# Patient Record
Sex: Female | Born: 2000 | Race: Black or African American | Hispanic: No | Marital: Single | Smoking: Never smoker
Health system: Southern US, Community
[De-identification: ages and names within clinical notes are randomized; demographics above are authoritative.]

## PROBLEM LIST (undated history)

## (undated) HISTORY — PX: SKIN TAG REMOVAL: SHX780

---
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
## 2023-07-19 ENCOUNTER — Emergency Department (HOSPITAL_BASED_OUTPATIENT_CLINIC_OR_DEPARTMENT_OTHER)
Admission: EM | Admit: 2023-07-19 | Discharge: 2023-07-20 | Disposition: A | Discharge status: Home / Self Care | Payer: Medicaid Other | Attending: Emergency Medicine | Admitting: Emergency Medicine

## 2023-07-19 ENCOUNTER — Encounter (HOSPITAL_BASED_OUTPATIENT_CLINIC_OR_DEPARTMENT_OTHER): Payer: Self-pay | Admitting: Urology

## 2023-07-19 ENCOUNTER — Other Ambulatory Visit: Payer: Self-pay

## 2023-07-19 DIAGNOSIS — L02415 Cutaneous abscess of right lower limb: Secondary | ICD-10-CM | POA: Diagnosis present

## 2023-07-19 MED ORDER — OXYCODONE-ACETAMINOPHEN 5-325 MG PO TABS
1.0000 | ORAL_TABLET | Freq: Once | ORAL | Status: AC
  Administered 2023-07-19: 1 via ORAL
  Filled 2023-07-19: qty 1

## 2023-07-19 MED ORDER — LIDOCAINE-EPINEPHRINE (PF) 2 %-1:200000 IJ SOLN
20.0000 mL | Freq: Once | INTRAMUSCULAR | Status: AC
  Administered 2023-07-19: 20 mL
  Filled 2023-07-19: qty 20

## 2023-07-19 MED ORDER — DOXYCYCLINE HYCLATE 100 MG PO CAPS: 100.0000 mg | ORAL_CAPSULE | Freq: Two times a day (BID) | ORAL | 0 refills | Status: AC

## 2023-07-19 MED ORDER — LORAZEPAM 1 MG PO TABS
1.0000 mg | ORAL_TABLET | Freq: Once | ORAL | Status: AC
Start: 2023-07-19 — End: 2023-07-19
  Administered 2023-07-19: 1 mg via ORAL
  Filled 2023-07-19: qty 1

## 2023-07-19 NOTE — Discharge Instructions (Addendum)
 You were seen in the emergency department for your skin abscesses on your right thigh.  We have drained these areas and cleaned them. I would like you to have the wound checked in 2-3 days. This can be done by any doctor's office, urgent care, or emergency department. This is to make sure the area hasn't closed too soon. Try to keep the area as clean and dry as possible. It is okay to let warm soapy water run over the area, but do NOT scrub the area.   I am placing you on a course of antibiotics. It is important you finish the entire course! You can take ibuprofen or tylenol  as needed for pain.   You can use an antiseptic (chlorhexidine) soap from the pharmacy 1-2 x per month in the areas where abscesses are most likely to form (armpits, buttocks, groin). This soap can dry your skin out so use it sparingly. Once do so once this area has fully healed.   Continue to monitor how you're doing and return to the ER for new or worsening symptoms.

## 2023-07-19 NOTE — ED Triage Notes (Signed)
 Abscess to right inner thigh in crease of groin that was noticed 2 days ago  No drainage per pt   Need work note as well

## 2023-07-19 NOTE — ED Provider Notes (Signed)
 [place] EMERGENCY DEPARTMENT AT [facility] Provider Note   CSN: [id number] Arrival date & time: 07/19/23  1432     History  Chief Complaint  Patient presents with   Abscess    Michele Gonzales is a 23 y.o. female.  Patient with no pertinent past medical history presents today with complaints of abscess. She states that she has had an abscess in her right upper inner thigh that has been present for the past few days. She also notes another abscess in her lower right thigh that has been present for the past several days as well. States that this one ruptured a few days ago, however then closed and is starting to hurt again. She states she has had a few skin abscesses previously and has required incision and drainage and antibiotics. She is requesting same. She denies fevers or chills. She is not a diabetic.   The history is provided by the patient. No language interpreter was used.  Abscess      Home Medications Prior to Admission medications   Not on File      Allergies    Patient has no known allergies.    Review of Systems   Review of Systems  All other systems reviewed and are negative.   Physical Exam Updated Vital Signs BP 120/74 (BP Location: Right Arm)   Pulse 82   Temp 98.5 F (36.9 C) (Oral)   Resp 16   Ht 5' 6 (1.676 m)   Wt 76.4 kg   LMP 06/30/2023   SpO2 100%   BMI 27.19 kg/m  Physical Exam Vitals and nursing note reviewed.  Constitutional:      General: She is not in acute distress.    Appearance: Normal appearance. She is normal weight. She is not ill-appearing, toxic-appearing or diaphoretic.  HENT:     Head: Normocephalic and atraumatic.  Cardiovascular:     Rate and Rhythm: Normal rate.  Pulmonary:     Effort: Pulmonary effort is normal. No respiratory distress.  Musculoskeletal:        General: Normal range of motion.     Cervical back: Normal range of motion.  Skin:    General: Skin is warm and dry.     Comments: Right  upper inner thigh with 2 cm x 2 cm area of fluctuance with surrounding induration. No active drainage.   Right lower inner thigh area with 1 cm x 1 cm area of fluctuance with surrounding induration. No active drainage.  Neurological:     General: No focal deficit present.     Mental Status: She is alert.  Psychiatric:        Mood and Affect: Mood normal.        Behavior: Behavior normal.     ED Results / Procedures / Treatments   Labs (all labs ordered are listed, but only abnormal results are displayed) Labs Reviewed - No data to display  EKG None  Radiology No results found.  Procedures .Incision and Drainage  Date/Time: 07/19/2023 11:45 PM  Performed by: Nora Lauraine LABOR, PA-C Authorized by: Ellyse Rotolo A, PA-C   Consent:    Consent obtained:  Verbal   Consent given by:  Patient   Risks, benefits, and alternatives were discussed: yes     Risks discussed:  Bleeding, incomplete drainage, pain, infection and damage to other organs   Alternatives discussed:  No treatment, delayed treatment, alternative treatment, observation and referral Universal protocol:    Procedure explained and questions  answered to patient or proxy's satisfaction: yes     Patient identity confirmed:  Verbally with patient Location:    Type:  Abscess   Size:  2 cm x 2 cm   Location:  Lower extremity   Lower extremity location:  Leg   Leg location:  R upper leg Pre-procedure details:    Skin preparation:  Povidone-iodine Sedation:    Sedation type:  Anxiolysis Anesthesia:    Anesthesia method:  Local infiltration   Local anesthetic:  Lidocaine  2% WITH epi Procedure type:    Complexity:  Complex Procedure details:    Ultrasound guidance: yes     Incision types:  Cruciate   Wound management:  Probed and deloculated   Drainage:  Purulent   Drainage amount:  Copious   Wound treatment:  Wound left open   Packing materials:  None Post-procedure details:    Procedure completion:  Tolerated  well, no immediate complications .Incision and Drainage  Date/Time: 07/19/2023 11:47 PM  Performed by: Nora Lauraine LABOR, PA-C Authorized by: Jaterrius Ricketson A, PA-C   Consent:    Consent obtained:  Verbal   Consent given by:  Patient   Risks, benefits, and alternatives were discussed: yes     Risks discussed:  Bleeding, incomplete drainage, pain, infection and damage to other organs   Alternatives discussed:  No treatment, delayed treatment, alternative treatment, observation and referral Universal protocol:    Procedure explained and questions answered to patient or proxy's satisfaction: yes     Patient identity confirmed:  Verbally with patient Location:    Type:  Abscess   Size:  1 cm x 1 cm   Location:  Lower extremity   Lower extremity location:  Leg   Leg location:  R upper leg Pre-procedure details:    Skin preparation:  Povidone-iodine Sedation:    Sedation type:  Anxiolysis Anesthesia:    Anesthesia method:  Local infiltration   Local anesthetic:  Lidocaine  2% WITH epi Procedure type:    Complexity:  Simple Procedure details:    Ultrasound guidance: yes     Incision types:  Cruciate   Wound management:  Probed and deloculated   Drainage:  Purulent   Drainage amount:  Moderate   Wound treatment:  Wound left open   Packing materials:  None Post-procedure details:    Procedure completion:  Tolerated well, no immediate complications .Ultrasound ED Soft Tissue  Date/Time: 07/19/2023 11:48 PM  Performed by: Nora Lauraine LABOR, PA-C Authorized by: Nora Lauraine LABOR, PA-C   Procedure details:    Indications: localization of abscess     Transverse view:  Visualized   Longitudinal view:  Visualized   Images: not archived   Location:    Location: lower extremity     Side:  Right Findings:     abscess present     Medications Ordered in ED Medications  lidocaine -EPINEPHrine  (XYLOCAINE  W/EPI) 2 %-1:200000 (PF) injection 20 mL (20 mLs Infiltration Given 07/19/23 2034)   oxyCODONE -acetaminophen  (PERCOCET/ROXICET) 5-325 MG per tablet 1 tablet (1 tablet Oral Given 07/19/23 2033)  LORazepam  (ATIVAN ) tablet 1 mg (1 mg Oral Given 07/19/23 2033)    ED Course/ Medical Decision Making/ A&P                                 Medical Decision Making Risk Prescription drug management.   Patient presents today with complaints of 2 abscesses noted to her right upper leg.  She is afebrile, nontoxic-appearing, and in no acute distress with reassuring vital signs.  Physical exam reveals 2 cm x 2 cm area of fluctuance with surrounding induration noted to the right upper inner thigh area.  Also 1 cm x 1 cm area of fluctuance with surrounding induration noted to the right lower inner thigh area.  Both of these visualized with bedside ultrasound and reveal drainable collections.  Patient with skin abscess amenable to incision and drainage per above procedure which was well-tolerated.  Abscess was not large enough to warrant packing or drain,  wound recheck in 2 days. Encouraged home warm soaks and flushing.  Mild signs of cellulitis is surrounding skin.  Will d/c to home with doxycycline . Evaluation and diagnostic testing in the emergency department does not suggest an emergent condition requiring admission or immediate intervention beyond what has been performed at this time.  Plan for discharge with close PCP follow-up.  Patient is understanding and amenable with plan, educated on red flag symptoms that would prompt immediate return.  Patient discharged in stable condition.  Final Clinical Impression(s) / ED Diagnoses Final diagnoses:  Cutaneous abscess of right lower extremity    Rx / DC Orders ED Discharge Orders          Ordered    doxycycline  (VIBRAMYCIN ) 100 MG capsule  2 times daily        07/19/23 2350          An After Visit Summary was printed and given to the patient.     Diva Lemberger A, PA-C 07/19/23 2351    Tegeler, Lonni PARAS, MD 07/19/23 [phone]
